# Patient Record
Sex: Male | Born: 1975 | Race: Black or African American | Hispanic: No | Marital: Married | Smoking: Never smoker
Health system: Southern US, Community
[De-identification: ages and names within clinical notes are randomized; demographics above are authoritative.]

---

## 2001-10-12 ENCOUNTER — Encounter: Payer: Self-pay | Admitting: Emergency Medicine

## 2001-10-12 ENCOUNTER — Emergency Department (HOSPITAL_COMMUNITY): Admission: EM | Admit: 2001-10-12 | Discharge: 2001-10-12 | Payer: Self-pay

## 2003-11-10 ENCOUNTER — Emergency Department (HOSPITAL_COMMUNITY): Admission: AD | Admit: 2003-11-10 | Discharge: 2003-11-10 | Payer: Self-pay | Admitting: Family Medicine

## 2004-01-30 ENCOUNTER — Emergency Department (HOSPITAL_COMMUNITY): Admission: EM | Admit: 2004-01-30 | Discharge: 2004-01-30 | Payer: Self-pay | Admitting: Family Medicine

## 2005-02-25 ENCOUNTER — Emergency Department (HOSPITAL_COMMUNITY): Admission: EM | Admit: 2005-02-25 | Discharge: 2005-02-25 | Payer: Self-pay | Admitting: Family Medicine

## 2005-11-22 ENCOUNTER — Emergency Department (HOSPITAL_COMMUNITY): Admission: EM | Admit: 2005-11-22 | Discharge: 2005-11-22 | Payer: Self-pay | Admitting: Family Medicine

## 2005-11-22 ENCOUNTER — Emergency Department (HOSPITAL_COMMUNITY): Admission: EM | Admit: 2005-11-22 | Discharge: 2005-11-22 | Payer: Self-pay | Admitting: Emergency Medicine

## 2005-11-23 ENCOUNTER — Emergency Department (HOSPITAL_COMMUNITY): Admission: EM | Admit: 2005-11-23 | Discharge: 2005-11-23 | Payer: Self-pay | Admitting: Family Medicine

## 2006-06-30 ENCOUNTER — Emergency Department (HOSPITAL_COMMUNITY): Admission: EM | Admit: 2006-06-30 | Discharge: 2006-06-30 | Payer: Self-pay | Admitting: Family Medicine

## 2007-01-27 ENCOUNTER — Emergency Department (HOSPITAL_COMMUNITY): Admission: EM | Admit: 2007-01-27 | Discharge: 2007-01-27 | Payer: Self-pay | Admitting: Family Medicine

## 2008-11-03 ENCOUNTER — Emergency Department (HOSPITAL_COMMUNITY): Admission: EM | Admit: 2008-11-03 | Discharge: 2008-11-03 | Payer: Self-pay | Admitting: Family Medicine

## 2008-11-10 ENCOUNTER — Emergency Department (HOSPITAL_COMMUNITY): Admission: EM | Admit: 2008-11-10 | Discharge: 2008-11-10 | Payer: Self-pay | Admitting: Emergency Medicine

## 2009-01-31 ENCOUNTER — Emergency Department (HOSPITAL_COMMUNITY): Admission: EM | Admit: 2009-01-31 | Discharge: 2009-01-31 | Payer: Self-pay | Admitting: Family Medicine

## 2011-11-06 ENCOUNTER — Encounter: Payer: Self-pay | Admitting: Emergency Medicine

## 2011-11-06 ENCOUNTER — Emergency Department (HOSPITAL_COMMUNITY)
Admission: EM | Admit: 2011-11-06 | Discharge: 2011-11-06 | Disposition: A | Discharge status: Home / Self Care | Payer: BC Managed Care – PPO | Source: Home / Self Care

## 2011-11-06 DIAGNOSIS — R6889 Other general symptoms and signs: Secondary | ICD-10-CM

## 2011-11-06 MED ORDER — ACETAMINOPHEN 325 MG PO TABS
ORAL_TABLET | ORAL | Status: AC
  Filled 2011-11-06: qty 2

## 2011-11-06 MED ORDER — ACETAMINOPHEN 325 MG PO TABS
650.0000 mg | ORAL_TABLET | Freq: Once | ORAL | Status: AC
  Administered 2011-11-06: 650 mg via ORAL

## 2011-11-06 MED ORDER — GUAIFENESIN-CODEINE 100-10 MG/5ML PO SYRP
ORAL_SOLUTION | ORAL | Status: AC
Start: 2011-11-06 — End: 2011-11-16

## 2011-11-06 NOTE — ED Provider Notes (Signed)
History     CSN: [id number] Arrival date & time: 11/06/2011  8:20 AM   None     Chief Complaint  Patient presents with  . Fever    (Consider location/radiation/quality/duration/timing/severity/associated sxs/prior treatment) HPI Comments: Onset of fever, cough, HA yesterday. Decreased appetite, and feels nauseated when tries to eat. No V/D. Cough is nonproductive. HA - aching base of occiput only. TMax 103. Took some cough syrup last night - pt uncertain, but thinks it may have been Nyquil.   Patient is a 35 y.o. male presenting with fever. The history is provided by the patient.  Fever Primary symptoms of the febrile illness include fever, fatigue, headaches, cough and nausea. Primary symptoms do not include wheezing, shortness of breath, abdominal pain, vomiting, diarrhea, myalgias, arthralgias or rash. The current episode started yesterday. This is a new problem. The problem has not changed since onset. The fever began yesterday. The fever has been unchanged since its onset. The maximum temperature recorded prior to his arrival was 103 to 104 F.  The headache began yesterday. The headache developed suddenly. Headache is a new problem. The headache is present continuously.  The cough began yesterday. The cough is new. The cough is non-productive.    History reviewed. No pertinent past medical history.  History reviewed. No pertinent past surgical history.  History reviewed. No pertinent family history.  History  Substance Use Topics  . Smoking status: Never Smoker   . Smokeless tobacco: Not on file  . Alcohol Use: No      Review of Systems  Constitutional: Positive for fever, appetite change and fatigue.  HENT: Positive for congestion. Negative for ear pain, sore throat, rhinorrhea, postnasal drip and sinus pressure.   Respiratory: Positive for cough. Negative for shortness of breath and wheezing.   Gastrointestinal: Positive for nausea. Negative for vomiting, abdominal  pain and diarrhea.  Musculoskeletal: Negative for myalgias and arthralgias.  Skin: Negative for rash.  Neurological: Positive for headaches. Negative for dizziness.    Allergies  Review of patient's allergies indicates no known allergies.  Home Medications   Current Outpatient Rx  Name Route Sig Dispense Refill  . NYQUIL PO Oral Take by mouth.      . GUAIFENESIN-CODEINE 100-10 MG/5ML PO SYRP  1-2 tsp every 6 hrs prn cough 120 mL 0    BP 116/66  Pulse 96  Temp(Src) 101.1 F (38.4 C) (Oral)  Resp 20  SpO2 97%  Physical Exam  Nursing note and vitals reviewed. Constitutional: He appears well-developed and well-nourished.       Pt appears ill, but NAD  HENT:  Head: Normocephalic and atraumatic.  Right Ear: Tympanic membrane, external ear and ear canal normal.  Left Ear: Tympanic membrane, external ear and ear canal normal.  Nose: Nose normal.  Mouth/Throat: Uvula is midline, oropharynx is clear and moist and mucous membranes are normal. No oropharyngeal exudate, posterior oropharyngeal edema or posterior oropharyngeal erythema.  Neck: Neck supple.  Cardiovascular: Normal rate, regular rhythm and normal heart sounds.   Pulmonary/Chest: Effort normal and breath sounds normal. No respiratory distress.  Abdominal: Soft. Bowel sounds are normal. He exhibits no distension and no mass. There is no tenderness. There is no guarding.  Musculoskeletal:       Cervical back: He exhibits tenderness. He exhibits normal range of motion, no bony tenderness, no swelling and no spasm.       Mild bilat muscular TTP superior cervical spine at insertion of occiput.  Lymphadenopathy:    He  has no cervical adenopathy.  Neurological: He is alert.  Skin: Skin is warm and dry.  Psychiatric: He has a normal mood and affect.    ED Course  Procedures (including critical care time)  Labs Reviewed - No data to display No results found.   1. Flu-like symptoms       MDM          Melody Comas, [place] 11/06/11 [phone]

## 2011-11-06 NOTE — ED Notes (Signed)
Fever that started yesterday am.  C/o back of head pain, cough, denies sore throat, denies ear pain.  Reports temp 103 yesterday

## 2011-11-06 NOTE — ED Provider Notes (Signed)
Medical screening examination/treatment/procedure(s) were performed by non-physician practitioner and as supervising physician I was immediately available for consultation/collaboration.  Luiz Blare MD   Luiz Blare, MD 11/06/11 [phone]

## 2014-02-07 ENCOUNTER — Emergency Department (HOSPITAL_COMMUNITY)
Admission: EM | Admit: 2014-02-07 | Discharge: 2014-02-07 | Disposition: A | Discharge status: Home / Self Care | Payer: BC Managed Care – PPO | Source: Home / Self Care | Attending: Family Medicine | Admitting: Family Medicine

## 2014-02-07 ENCOUNTER — Encounter (HOSPITAL_COMMUNITY): Payer: Self-pay | Admitting: Emergency Medicine

## 2014-02-07 DIAGNOSIS — J302 Other seasonal allergic rhinitis: Secondary | ICD-10-CM

## 2014-02-07 DIAGNOSIS — J309 Allergic rhinitis, unspecified: Secondary | ICD-10-CM

## 2014-02-07 MED ORDER — IPRATROPIUM BROMIDE 0.06 % NA SOLN: 2.0000 | Freq: Four times a day (QID) | NASAL | Status: DC

## 2014-02-07 MED ORDER — FEXOFENADINE HCL 180 MG PO TABS: 180.0000 mg | ORAL_TABLET | Freq: Every day | ORAL | Status: DC

## 2014-02-07 NOTE — ED Provider Notes (Signed)
CSN: [id number]     Arrival date & time 02/07/14  [id number] History   First MD Initiated Contact with Patient 02/07/14 0914     Chief Complaint  Patient presents with  . Bronchitis   (Consider location/radiation/quality/duration/timing/severity/associated sxs/prior Treatment) Patient is a 38 y.o. male presenting with cough. The history is provided by the patient.  Cough Cough characteristics:  Productive Sputum characteristics:  Yellow Severity:  Moderate Onset quality:  Sudden Duration:  2 days Progression:  Worsening Chronicity:  New Smoker: no   Context: weather changes   Associated symptoms: rhinorrhea   Associated symptoms: no fever, no shortness of breath, no sinus congestion, no sore throat and no wheezing     History reviewed. No pertinent past medical history. History reviewed. No pertinent past surgical history. No family history on file. History  Substance Use Topics  . Smoking status: Never Smoker   . Smokeless tobacco: Not on file  . Alcohol Use: No    Review of Systems  Constitutional: Negative.  Negative for fever.  HENT: Positive for congestion, postnasal drip and rhinorrhea. Negative for sore throat.   Respiratory: Positive for cough. Negative for shortness of breath and wheezing.   Cardiovascular: Negative.     Allergies  Review of patient's allergies indicates no known allergies.  Home Medications   Current Outpatient Rx  Name  Route  Sig  Dispense  Refill  . dextromethorphan 15 MG/5ML syrup   Oral   Take 10 mLs by mouth 4 (four) times daily as needed for cough.         . fexofenadine (ALLEGRA) 180 MG tablet   Oral   Take 1 tablet (180 mg total) by mouth daily.   30 tablet   1   . ipratropium (ATROVENT) 0.06 % nasal spray   Each Nare   Place 2 sprays into both nostrils 4 (four) times daily.   15 mL   2   . Pseudoeph-Doxylamine-DM-APAP (NYQUIL PO)   Oral   Take by mouth.            BP 123/79  Pulse 94  Temp(Src) 99.4 F (37.4 C)  (Oral)  Resp 16  SpO2 98% Physical Exam  Nursing note and vitals reviewed. Constitutional: He is oriented to person, place, and time. He appears well-developed and well-nourished.  HENT:  Head: Normocephalic.  Right Ear: External ear normal.  Left Ear: External ear normal.  Nose: Mucosal edema and rhinorrhea present.  Mouth/Throat: Oropharynx is clear and moist.  Neck: Normal range of motion. Neck supple.  Cardiovascular: Normal heart sounds.   Pulmonary/Chest: Effort normal and breath sounds normal. He has no wheezes.  Lymphadenopathy:    He has no cervical adenopathy.  Neurological: He is alert and oriented to person, place, and time.  Skin: Skin is warm and dry.    ED Course  Procedures (including critical care time) Labs Review Labs Reviewed - No data to display Imaging Review No results found.   MDM   1. Seasonal allergic rhinitis        Linna HoffJames D Serapio Edelson, MD 02/07/14 [id number]

## 2014-02-07 NOTE — ED Notes (Signed)
Onset Sunday of symptoms: throat and chest pain with cough. Green mucus with coughing, hot and cold chills.

## 2015-03-18 ENCOUNTER — Emergency Department (INDEPENDENT_AMBULATORY_CARE_PROVIDER_SITE_OTHER)
Admission: EM | Admit: 2015-03-18 | Discharge: 2015-03-18 | Disposition: A | Discharge status: Home / Self Care | Payer: BLUE CROSS/BLUE SHIELD | Source: Home / Self Care | Attending: Emergency Medicine | Admitting: Emergency Medicine

## 2015-03-18 ENCOUNTER — Encounter (HOSPITAL_COMMUNITY): Payer: Self-pay | Admitting: Emergency Medicine

## 2015-03-18 DIAGNOSIS — S161XXA Strain of muscle, fascia and tendon at neck level, initial encounter: Secondary | ICD-10-CM | POA: Diagnosis not present

## 2015-03-18 DIAGNOSIS — M5412 Radiculopathy, cervical region: Secondary | ICD-10-CM | POA: Diagnosis not present

## 2015-03-18 MED ORDER — CYCLOBENZAPRINE HCL 10 MG PO TABS: 10.0000 mg | ORAL_TABLET | Freq: Every day | ORAL | Status: AC

## 2015-03-18 MED ORDER — PREDNISONE 50 MG PO TABS: ORAL_TABLET | ORAL | Status: AC

## 2015-03-18 MED ORDER — GABAPENTIN 300 MG PO CAPS: 300.0000 mg | ORAL_CAPSULE | Freq: Every day | ORAL | Status: AC

## 2015-03-18 NOTE — Discharge Instructions (Signed)
You have a muscle strain that is irritating a nerve. Take prednisone 1 pill a day for 5 days. Take Flexeril at bedtime. This medicine will make you sleepy. Take gabapentin at bedtime for the next 2 weeks. This medicine will make you sleepy. Continue to use heat 2-3 times a day. You can take ibuprofen 800 mg up to 3 times a day. Avoid shoulder and neck lifting for the next week or so. You should see some improvement in 2-3 days, but this will take at least 1-2 weeks to heal. Please come back if you have not seen any improvement in 1 week or you develop weakness in the right arm or hand.

## 2015-03-18 NOTE — ED Notes (Signed)
Reports injuring neck  When lifting weights several weeks back.   Pt states that two weeks ago he was ducking to under door way but when he went to stand up he did not clear door way and hit head.  States irritated neck causing shooting pain down right side of neck, arm, and shoulder.    Mild relief with heating pad and ibuprofen.

## 2015-03-18 NOTE — ED Provider Notes (Signed)
CSN: [id number]     Arrival date & time 03/18/15  0901 History   First MD Initiated Contact with Patient 03/18/15 [phone]     Chief Complaint  Patient presents with  . Neck Pain   (Consider location/radiation/quality/duration/timing/severity/associated sxs/prior Treatment) HPI He is a 39 year old man here for evaluation of neck pain. He states he initially strained his neck about a month ago lifting weights. He reports pain in the right side of his neck. He would have a pulling sensation going down into the right side of his upper back with forward flexion. He also describes a shooting pain that would go into his right shoulder. He denies any numbness, tingling, weakness in the right upper extremity. He states his symptoms started to improve, but then about 2 weeks ago he jammed his head against a car roof when getting out of a car, and his symptoms worsened. He has continued to go to the gym and lift. He states once he gets warmed up he feels really good. He has also been taking ibuprofen with some improvement.  History reviewed. No pertinent past medical history. History reviewed. No pertinent past surgical history. History reviewed. No pertinent family history. History  Substance Use Topics  . Smoking status: Never Smoker   . Smokeless tobacco: Not on file  . Alcohol Use: No    Review of Systems As in history of present illness Allergies  Review of patient's allergies indicates no known allergies.  Home Medications   Prior to Admission medications   Medication Sig Start Date End Date Taking? Authorizing Provider  cyclobenzaprine (FLEXERIL) 10 MG tablet Take 1 tablet (10 mg total) by mouth at bedtime. For 2 weeks. 03/18/15   Charm RingsErin J Honig, MD  gabapentin (NEURONTIN) 300 MG capsule Take 1 capsule (300 mg total) by mouth at bedtime. For 2 weeks. 03/18/15   Charm RingsErin J Honig, MD  predniSONE (DELTASONE) 50 MG tablet Take 1 pill daily for 5 days. 03/18/15   Charm RingsErin J Honig, MD   BP 114/81 mmHg   Temp(Src) 99 F (37.2 C) (Oral)  Resp 16  SpO2 96% Physical Exam  Constitutional: He is oriented to person, place, and time. He appears well-developed and well-nourished. No distress.  Neck: Normal range of motion. Neck supple.  No vertebral tenderness or step-offs. He is tender along the right thoracic paraspinous muscles as well as the right superior trapezius. He has full range of motion of the neck, but does have some pain with full forward flexion. He has 5 out of 5 strength in deltoids, biceps, triceps, grip, interosseus. Sensation grossly intact to light touch. 2+ brachial and bicipital reflexes. Positive Spurling's.  Cardiovascular: Normal rate.   Pulmonary/Chest: Effort normal.  Neurological: He is alert and oriented to person, place, and time.    ED Course  Procedures (including critical care time) Labs Review Labs Reviewed - No data to display  Imaging Review No results found.   MDM   1. Cervical muscle strain, initial encounter   2. Cervical radiculitis    We'll treat with a five-day course of prednisone, Flexeril, and gabapentin. Return precautions reviewed.    Charm RingsErin J Honig, MD 03/18/15 1027

## 2024-05-10 ENCOUNTER — Other Ambulatory Visit: Payer: Self-pay | Admitting: Occupational Medicine

## 2024-05-10 ENCOUNTER — Ambulatory Visit
Admission: RE | Admit: 2024-05-10 | Discharge: 2024-05-10 | Disposition: A | Discharge status: Home / Self Care | Payer: Worker's Compensation | Source: Ambulatory Visit | Attending: Occupational Medicine | Admitting: Occupational Medicine

## 2024-05-17 ENCOUNTER — Other Ambulatory Visit: Payer: Self-pay | Admitting: Family Medicine

## 2024-05-17 DIAGNOSIS — R748 Abnormal levels of other serum enzymes: Secondary | ICD-10-CM
